# Patient Record
Sex: Male | Born: 1940 | Race: Black or African American | Hispanic: No | Marital: Single | Smoking: Former smoker
Health system: Southern US, Community
[De-identification: ages and names within clinical notes are randomized; demographics above are authoritative.]

## PROBLEM LIST (undated history)

## (undated) DIAGNOSIS — M25519 Pain in unspecified shoulder: Secondary | ICD-10-CM

## (undated) DIAGNOSIS — E119 Type 2 diabetes mellitus without complications: Secondary | ICD-10-CM

## (undated) DIAGNOSIS — R1310 Dysphagia, unspecified: Secondary | ICD-10-CM

## (undated) DIAGNOSIS — K219 Gastro-esophageal reflux disease without esophagitis: Secondary | ICD-10-CM

## (undated) DIAGNOSIS — I10 Essential (primary) hypertension: Secondary | ICD-10-CM

## (undated) DIAGNOSIS — E785 Hyperlipidemia, unspecified: Secondary | ICD-10-CM

## (undated) DIAGNOSIS — M1990 Unspecified osteoarthritis, unspecified site: Secondary | ICD-10-CM

## (undated) HISTORY — PX: BACK SURGERY: SHX140

---

## 2015-03-04 ENCOUNTER — Encounter: Payer: Self-pay | Admitting: *Deleted

## 2015-03-05 ENCOUNTER — Ambulatory Visit
Admission: RE | Admit: 2015-03-05 | Discharge: 2015-03-05 | Disposition: A | Discharge status: Home / Self Care | Payer: Medicare Other | Source: Ambulatory Visit | Attending: Gastroenterology | Admitting: Gastroenterology

## 2015-03-05 ENCOUNTER — Ambulatory Visit: Payer: Medicare Other | Admitting: Anesthesiology

## 2015-03-05 ENCOUNTER — Encounter
Admission: RE | Disposition: A | Discharge status: Home / Self Care | Payer: Self-pay | Source: Ambulatory Visit | Attending: Gastroenterology

## 2015-03-05 ENCOUNTER — Encounter: Payer: Self-pay | Admitting: Anesthesiology

## 2015-03-05 DIAGNOSIS — R1310 Dysphagia, unspecified: Secondary | ICD-10-CM | POA: Diagnosis not present

## 2015-03-05 DIAGNOSIS — Z87891 Personal history of nicotine dependence: Secondary | ICD-10-CM | POA: Insufficient documentation

## 2015-03-05 DIAGNOSIS — M1990 Unspecified osteoarthritis, unspecified site: Secondary | ICD-10-CM | POA: Insufficient documentation

## 2015-03-05 DIAGNOSIS — K635 Polyp of colon: Secondary | ICD-10-CM | POA: Diagnosis not present

## 2015-03-05 DIAGNOSIS — E119 Type 2 diabetes mellitus without complications: Secondary | ICD-10-CM | POA: Diagnosis not present

## 2015-03-05 DIAGNOSIS — E785 Hyperlipidemia, unspecified: Secondary | ICD-10-CM | POA: Insufficient documentation

## 2015-03-05 DIAGNOSIS — Z7982 Long term (current) use of aspirin: Secondary | ICD-10-CM | POA: Insufficient documentation

## 2015-03-05 DIAGNOSIS — I10 Essential (primary) hypertension: Secondary | ICD-10-CM | POA: Diagnosis not present

## 2015-03-05 DIAGNOSIS — K219 Gastro-esophageal reflux disease without esophagitis: Secondary | ICD-10-CM | POA: Diagnosis not present

## 2015-03-05 DIAGNOSIS — J45909 Unspecified asthma, uncomplicated: Secondary | ICD-10-CM | POA: Insufficient documentation

## 2015-03-05 DIAGNOSIS — K5730 Diverticulosis of large intestine without perforation or abscess without bleeding: Secondary | ICD-10-CM | POA: Diagnosis not present

## 2015-03-05 DIAGNOSIS — K644 Residual hemorrhoidal skin tags: Secondary | ICD-10-CM | POA: Diagnosis not present

## 2015-03-05 DIAGNOSIS — Z1211 Encounter for screening for malignant neoplasm of colon: Secondary | ICD-10-CM | POA: Insufficient documentation

## 2015-03-05 HISTORY — DX: Gastro-esophageal reflux disease without esophagitis: K21.9

## 2015-03-05 HISTORY — DX: Pain in unspecified shoulder: M25.519

## 2015-03-05 HISTORY — DX: Essential (primary) hypertension: I10

## 2015-03-05 HISTORY — PX: ESOPHAGOGASTRODUODENOSCOPY (EGD) WITH PROPOFOL: SHX5813

## 2015-03-05 HISTORY — PX: COLONOSCOPY WITH PROPOFOL: SHX5780

## 2015-03-05 HISTORY — DX: Dysphagia, unspecified: R13.10

## 2015-03-05 HISTORY — DX: Unspecified osteoarthritis, unspecified site: M19.90

## 2015-03-05 HISTORY — DX: Type 2 diabetes mellitus without complications: E11.9

## 2015-03-05 HISTORY — DX: Hyperlipidemia, unspecified: E78.5

## 2015-03-05 LAB — GLUCOSE, CAPILLARY: GLUCOSE-CAPILLARY: 91 mg/dL (ref 65–99)

## 2015-03-05 SURGERY — COLONOSCOPY WITH PROPOFOL
Anesthesia: General

## 2015-03-05 MED ORDER — PROPOFOL 500 MG/50ML IV EMUL
INTRAVENOUS | Status: DC | PRN
  Administered 2015-03-05: 120 ug/kg/min via INTRAVENOUS

## 2015-03-05 MED ORDER — EPHEDRINE SULFATE 50 MG/ML IJ SOLN
INTRAMUSCULAR | Status: DC | PRN
  Administered 2015-03-05: 5 mg via INTRAVENOUS

## 2015-03-05 MED ORDER — FENTANYL CITRATE (PF) 100 MCG/2ML IJ SOLN
INTRAMUSCULAR | Status: DC | PRN
Start: 2015-03-05 — End: 2015-03-05
  Administered 2015-03-05: 50 ug via INTRAVENOUS

## 2015-03-05 MED ORDER — MIDAZOLAM HCL 2 MG/2ML IJ SOLN
INTRAMUSCULAR | Status: DC | PRN
  Administered 2015-03-05: 1 mg via INTRAVENOUS

## 2015-03-05 MED ORDER — LIDOCAINE HCL (CARDIAC) 20 MG/ML IV SOLN
INTRAVENOUS | Status: DC | PRN
  Administered 2015-03-05: 50 mg via INTRAVENOUS

## 2015-03-05 MED ORDER — SODIUM CHLORIDE 0.9 % IV SOLN
INTRAVENOUS | Status: DC
  Administered 2015-03-05: 10:00:00 via INTRAVENOUS

## 2015-03-05 NOTE — Anesthesia Procedure Notes (Signed)
Performed by: COOK-MARTIN, Addysen Louth Pre-anesthesia Checklist: Patient identified, Emergency Drugs available, Suction available, Patient being monitored and Timeout performed Patient Re-evaluated:Patient Re-evaluated prior to inductionOxygen Delivery Method: Nasal cannula Preoxygenation: Pre-oxygenation with 100% oxygen Intubation Type: IV induction Airway Equipment and Method: Bite block Placement Confirmation: positive ETCO2 and CO2 detector     

## 2015-03-05 NOTE — H&P (Signed)
Primary Care Physician:  Barbette Reichmann, MD  Pre-Procedure History & Physical: HPI:  Luke Owen is a 74 y.o. male is here for an colonoscopy / EGD   Past Medical History  Diagnosis Date  . Hypertension   . Diabetes mellitus without complication (HCC)   . Hyperlipidemia   . Arthritis   . Shoulder joint pain   . GERD (gastroesophageal reflux disease)   . Dysphagia     Past Surgical History  Procedure Laterality Date  . Back surgery      2012    Prior to Admission medications   Medication Sig Start Date End Date Taking? Authorizing Provider  Albuterol Sulfate (PROAIR RESPICLICK) 108 (90 BASE) MCG/ACT AEPB Inhale 2 puffs into the lungs 4 (four) times daily as needed.   Yes Historical Provider, MD  ascorbic acid (VITAMIN C) 1000 MG tablet Take 1,000 mg by mouth daily.   Yes Historical Provider, MD  aspirin EC 325 MG tablet Take 325 mg by mouth daily.   Yes Historical Provider, MD  atorvastatin (LIPITOR) 40 MG tablet Take 40 mg by mouth daily.   Yes Historical Provider, MD  glipiZIDE (GLUCOTROL XL) 10 MG 24 hr tablet Take 10 mg by mouth daily with breakfast.   Yes Historical Provider, MD  losartan (COZAAR) 100 MG tablet Take 100 mg by mouth daily.   Yes Historical Provider, MD  meloxicam (MOBIC) 7.5 MG tablet Take 7.5 mg by mouth daily.   Yes Historical Provider, MD  metFORMIN (GLUCOPHAGE) 1000 MG tablet Take 1,000 mg by mouth 2 (two) times daily with a meal.   Yes Historical Provider, MD  Multiple Vitamin (MULTIVITAMIN) tablet Take 1 tablet by mouth daily.   Yes Historical Provider, MD  pantoprazole (PROTONIX) 40 MG tablet Take 40 mg by mouth 2 (two) times daily before a meal.   Yes Historical Provider, MD  sildenafil (VIAGRA) 100 MG tablet Take 100 mg by mouth daily as needed for erectile dysfunction.   Yes Historical Provider, MD  vitamin E 400 UNIT capsule Take 400 Units by mouth daily.   Yes Historical Provider, MD    Allergies as of 02/03/2015  . (Not on File)     History reviewed. No pertinent family history.  Social History   Social History  . Marital Status: Single    Spouse Name: N/A  . Number of Children: N/A  . Years of Education: N/A   Occupational History  . Not on file.   Social History Main Topics  . Smoking status: Former Smoker    Quit date: 03/20/1964  . Smokeless tobacco: Never Used  . Alcohol Use: No  . Drug Use: No  . Sexual Activity: Not on file   Other Topics Concern  . Not on file   Social History Narrative     Physical Exam: BP 151/75 mmHg  Pulse 55  Temp(Src) 98.5 F (36.9 C) (Oral)  Resp 17  Ht  (1.803 m)  Wt 91.627 kg (202 lb)  BMI 28.19 kg/m2  SpO2 99% General:   Alert,  pleasant and cooperative in NAD Head:  Normocephalic and atraumatic. Neck:  Supple; no masses or thyromegaly. Lungs:  Clear throughout to auscultation.    Heart:  Regular rate and rhythm. Abdomen:  Soft, nontender and nondistended. Normal bowel sounds, without guarding, and without rebound.   Neurologic:  Alert and  oriented x4;  grossly normal neurologically.  Impression/Plan: Luke Owen is here for an colonoscopy to be performed for screening, EGD for dysphagia  Risks,  benefits, limitations, and alternatives regarding  Colonoscopy / EGD have been reviewed with the patient.  Questions have been answered.  All parties agreeable.   Elnita Maxwell, MD  03/05/2015, 9:41 AM

## 2015-03-05 NOTE — Transfer of Care (Signed)
Immediate Anesthesia Transfer of Care Note  Patient: Luke Owen  Procedure(s) Performed: Procedure(s): COLONOSCOPY WITH PROPOFOL (N/A) ESOPHAGOGASTRODUODENOSCOPY (EGD) WITH PROPOFOL (N/A)  Patient Location: PACU  Anesthesia Type:General  Level of Consciousness: awake and oriented  Airway & Oxygen Therapy: Patient Spontanous Breathing and Patient connected to nasal cannula oxygen  Post-op Assessment: Report given to RN and Post -op Vital signs reviewed and stable  Post vital signs: Reviewed and stable  Last Vitals:  Filed Vitals:   03/05/15 0937  BP: 151/75  Pulse: 55  Temp: 36.9 C  Resp: 17    Complications: No apparent anesthesia complications

## 2015-03-05 NOTE — Anesthesia Preprocedure Evaluation (Signed)
Anesthesia Evaluation  Patient identified by MRN, date of birth, ID band Patient awake    Reviewed: Allergy & Precautions, H&P , NPO status , Patient's Chart, lab work & pertinent test results  Airway Mallampati: III  TM Distance: >3 FB Neck ROM: limited    Dental no notable dental hx. (+) Teeth Intact   Pulmonary neg shortness of breath, asthma , former smoker,    Pulmonary exam normal breath sounds clear to auscultation       Cardiovascular Exercise Tolerance: Good hypertension, (-) Past MI Normal cardiovascular exam Rhythm:regular Rate:Normal     Neuro/Psych negative neurological ROS  negative psych ROS   GI/Hepatic Neg liver ROS, GERD  Controlled,  Endo/Other  diabetes, Well Controlled, Type 2, Oral Hypoglycemic Agents  Renal/GU negative Renal ROS  negative genitourinary   Musculoskeletal   Abdominal   Peds  Hematology negative hematology ROS (+)   Anesthesia Other Findings Past Medical History:   Hypertension                                                 Diabetes mellitus without complication (HCC)                 Hyperlipidemia                                               Arthritis                                                    Shoulder joint pain                                          GERD (gastroesophageal reflux disease)                       Dysphagia                                                    Reproductive/Obstetrics negative OB ROS                             Anesthesia Physical Anesthesia Plan  ASA: III  Anesthesia Plan: General   Post-op Pain Management:    Induction:   Airway Management Planned:   Additional Equipment:   Intra-op Plan:   Post-operative Plan:   Informed Consent: I have reviewed the patients History and Physical, chart, labs and discussed the procedure including the risks, benefits and alternatives for the proposed anesthesia  with the patient or authorized representative who has indicated his/her understanding and acceptance.   Dental Advisory Given  Plan Discussed with: Anesthesiologist, CRNA and Surgeon  Anesthesia Plan Comments:         Anesthesia Quick Evaluation

## 2015-03-05 NOTE — Anesthesia Postprocedure Evaluation (Signed)
  Anesthesia Post-op Note  Patient: Luke Owen  Procedure(s) Performed: Procedure(s): COLONOSCOPY WITH PROPOFOL (N/A) ESOPHAGOGASTRODUODENOSCOPY (EGD) WITH PROPOFOL (N/A)  Anesthesia type:General  Patient location: PACU  Post pain: Pain level controlled  Post assessment: Post-op Vital signs reviewed, Patient's Cardiovascular Status Stable, Respiratory Function Stable, Patent Airway and No signs of Nausea or vomiting  Post vital signs: Reviewed and stable  Last Vitals:  Filed Vitals:   03/05/15 1110  BP: 151/84  Pulse: 51  Temp:   Resp: 14    Level of consciousness: awake, alert  and patient cooperative  Complications: No apparent anesthesia complications

## 2015-03-05 NOTE — Op Note (Signed)
[facility] Gastroenterology Patient Name: Luke Owen Procedure Date: 03/05/2015 9:42 AM MRN: [id number] Account #: [id number] Date of Birth: [birth year] Admit Type: Outpatient Age: 74 Room: [facility] ENDO ROOM 2 Gender: Male Note Status: Finalized Procedure:         Upper GI endoscopy Indications:       Dysphagia(improved on PPI) Patient Profile:   This is a 74 year old male. Providers:         Rhona Raider. Shelle Iron, MD Referring MD:      Barbette Reichmann, MD (Referring MD) Medicines:         Propofol per Anesthesia Complications:     No immediate complications. Procedure:         Pre-Anesthesia Assessment:                    - Prior to the procedure, a History and Physical was                     performed, and patient medications, allergies and                     sensitivities were reviewed. The patient's tolerance of                     previous anesthesia was reviewed.                    After obtaining informed consent, the endoscope was passed                     under direct vision. Throughout the procedure, the                     patient's blood pressure, pulse, and oxygen saturations                     were monitored continuously. The Endoscope was introduced                     through the mouth, and advanced to the second part of                     duodenum. The upper GI endoscopy was accomplished without                     difficulty. The patient tolerated the procedure well. Findings:      The esophagus was normal.      The stomach was normal.      The examined duodenum was normal.      Four biopsies were obtained with cold forceps for histology randomly in       the lower third of the esophagus.      Four biopsies were obtained with cold forceps for histology randomly in       the upper third of the esophagus. Impression:        - Normal esophagus.                    - Normal stomach.                    - Normal examined duodenum.  - Four biopsies were obtained in the lower third of the                     esophagus.                    -  Four biopsies were obtained in the upper third of the                     esophagus. Recommendation:    - Perform a colonoscopy today.                    - If biopsies are non-diagnostic and trouble swallowing                     does not continue to improve, obtain esophageal manometry                     study                    - Await pathology results.                    - Continue present medications.                    - The findings and recommendations were discussed with the                     patient.                    - The findings and recommendations were discussed with the                     patient's family. Procedure Code(s): --- Professional ---                    [phone], Esophagogastroduodenoscopy, flexible, transoral;                     with biopsy, single or multiple CPT copyright 2014 American [employer] Association. All rights reserved. The codes documented in this report are preliminary and upon coder review may  be revised to meet current compliance requirements. Kathalene Frames, MD 03/05/2015 10:00:16 AM This report has been signed electronically. Number of Addenda: 0 Note Initiated On: 03/05/2015 9:42 AM      [facility]

## 2015-03-05 NOTE — Op Note (Signed)
[facility] Gastroenterology Patient Name: Luke Owen Procedure Date: 03/05/2015 10:00 AM MRN: [id number] Account #: [id number] Date of Birth: 30-Dec-1940 Admit Type: Outpatient Age: 74 Room: [facility] ENDO ROOM 2 Gender: Male Note Status: Finalized Procedure:         Colonoscopy Indications:       Screening for colorectal malignant neoplasm, Last                     colonoscopy 10 years ago Patient Profile:   This is a 74 year old male. Providers:         Rhona Raider. Shelle Iron, MD Referring MD:      Barbette Reichmann, MD (Referring MD) Medicines:         Propofol per Anesthesia Complications:     No immediate complications. Procedure:         Pre-Anesthesia Assessment:                    - Prior to the procedure, a History and Physical was                     performed, and patient medications, allergies and                     sensitivities were reviewed. The patient's tolerance of                     previous anesthesia was reviewed.                    After obtaining informed consent, the colonoscope was                     passed under direct vision. Throughout the procedure, the                     patient's blood pressure, pulse, and oxygen saturations                     were monitored continuously. The Colonoscope was                     introduced through the anus and advanced to the the cecum,                     identified by appendiceal orifice and ileocecal valve. The                     colonoscopy was performed without difficulty. The patient                     tolerated the procedure well. The quality of the bowel                     preparation was excellent. Findings:      The perianal exam findings include non-thrombosed external hemorrhoids.      A 3 mm polyp was found in the ascending colon. The polyp was sessile.       The polyp was removed with a jumbo cold forceps. Resection and retrieval       were complete.      A few large-mouthed diverticula  were found in the ascending colon.      A few small and large-mouthed diverticula were found in the sigmoid       colon.  The exam was otherwise without abnormality. Impression:        - Non-thrombosed external hemorrhoids found on perianal                     exam.                    - One 3 mm polyp in the ascending colon. Resected and                     retrieved.                    - Diverticulosis in the ascending colon.                    - Diverticulosis in the sigmoid colon.                    - The examination was otherwise normal. Recommendation:    - Observe patient in GI recovery unit.                    - High fiber diet.                    - Continue present medications.                    - Await pathology results.                    - Would not perform further screening colonoscopies based                     on age.                    - The findings and recommendations were discussed with the                     patient.                    - The findings and recommendations were discussed with the                     patient's family. Procedure Code(s): --- Professional ---                    [id number], Colonoscopy, flexible; with biopsy, single or                     multiple CPT copyright 2014 American Medical Association. All rights reserved. The codes documented in this report are preliminary and upon coder review may  be revised to meet current compliance requirements. Kathalene Frames, MD 03/05/2015 10:27:06 AM This report has been signed electronically. Number of Addenda: 0 Note Initiated On: 03/05/2015 10:00 AM Scope Withdrawal Time: 0 hours 10 minutes 52 seconds  Total Procedure Duration: 0 hours 19 minutes 37 seconds       [facility]

## 2015-03-08 ENCOUNTER — Encounter: Payer: Self-pay | Admitting: Gastroenterology

## 2015-03-08 LAB — SURGICAL PATHOLOGY

## 2016-09-21 ENCOUNTER — Ambulatory Visit
Admission: RE | Admit: 2016-09-21 | Discharge: 2016-09-21 | Disposition: A | Discharge status: Home / Self Care | Payer: Medicare Other | Source: Ambulatory Visit | Attending: Internal Medicine | Admitting: Internal Medicine

## 2016-09-21 ENCOUNTER — Other Ambulatory Visit: Payer: Self-pay | Admitting: Internal Medicine

## 2016-09-21 DIAGNOSIS — E11622 Type 2 diabetes mellitus with other skin ulcer: Secondary | ICD-10-CM

## 2016-09-21 DIAGNOSIS — L97909 Non-pressure chronic ulcer of unspecified part of unspecified lower leg with unspecified severity: Secondary | ICD-10-CM | POA: Insufficient documentation

## 2016-09-21 DIAGNOSIS — M7989 Other specified soft tissue disorders: Secondary | ICD-10-CM

## 2016-10-05 ENCOUNTER — Encounter: Payer: Self-pay | Admitting: *Deleted

## 2016-10-05 ENCOUNTER — Emergency Department
Admission: EM | Admit: 2016-10-05 | Discharge: 2016-10-05 | Disposition: A | Discharge status: Home / Self Care | Payer: Medicare Other | Attending: Emergency Medicine | Admitting: Emergency Medicine

## 2016-10-05 ENCOUNTER — Emergency Department: Payer: Medicare Other

## 2016-10-05 DIAGNOSIS — Z87891 Personal history of nicotine dependence: Secondary | ICD-10-CM | POA: Diagnosis not present

## 2016-10-05 DIAGNOSIS — Z7982 Long term (current) use of aspirin: Secondary | ICD-10-CM | POA: Diagnosis not present

## 2016-10-05 DIAGNOSIS — I10 Essential (primary) hypertension: Secondary | ICD-10-CM | POA: Diagnosis not present

## 2016-10-05 DIAGNOSIS — R109 Unspecified abdominal pain: Secondary | ICD-10-CM | POA: Diagnosis present

## 2016-10-05 DIAGNOSIS — Z7984 Long term (current) use of oral hypoglycemic drugs: Secondary | ICD-10-CM | POA: Insufficient documentation

## 2016-10-05 DIAGNOSIS — N3289 Other specified disorders of bladder: Secondary | ICD-10-CM

## 2016-10-05 DIAGNOSIS — Z79899 Other long term (current) drug therapy: Secondary | ICD-10-CM | POA: Diagnosis not present

## 2016-10-05 DIAGNOSIS — E119 Type 2 diabetes mellitus without complications: Secondary | ICD-10-CM | POA: Diagnosis not present

## 2016-10-05 LAB — URINALYSIS, COMPLETE (UACMP) WITH MICROSCOPIC
Bacteria, UA: NONE SEEN
Bilirubin Urine: NEGATIVE
GLUCOSE, UA: NEGATIVE mg/dL
Hgb urine dipstick: NEGATIVE
KETONES UR: NEGATIVE mg/dL
Leukocytes, UA: NEGATIVE
Nitrite: NEGATIVE
PROTEIN: NEGATIVE mg/dL
Specific Gravity, Urine: 1.015 (ref 1.005–1.030)
Squamous Epithelial / LPF: NONE SEEN
pH: 5 (ref 5.0–8.0)

## 2016-10-05 LAB — CBC
HCT: 35.1 % — ABNORMAL LOW (ref 40.0–52.0)
HEMOGLOBIN: 12.1 g/dL — AB (ref 13.0–18.0)
MCH: 29.7 pg (ref 26.0–34.0)
MCHC: 34.5 g/dL (ref 32.0–36.0)
MCV: 85.8 fL (ref 80.0–100.0)
Platelets: 172 10*3/uL (ref 150–440)
RBC: 4.09 MIL/uL — AB (ref 4.40–5.90)
RDW: 14.5 % (ref 11.5–14.5)
WBC: 4.4 10*3/uL (ref 3.8–10.6)

## 2016-10-05 LAB — COMPREHENSIVE METABOLIC PANEL
ALT: 20 U/L (ref 17–63)
ANION GAP: 9 (ref 5–15)
AST: 33 U/L (ref 15–41)
Albumin: 4.3 g/dL (ref 3.5–5.0)
Alkaline Phosphatase: 72 U/L (ref 38–126)
BUN: 21 mg/dL — ABNORMAL HIGH (ref 6–20)
CALCIUM: 9.4 mg/dL (ref 8.9–10.3)
CHLORIDE: 105 mmol/L (ref 101–111)
CO2: 24 mmol/L (ref 22–32)
Creatinine, Ser: 1.15 mg/dL (ref 0.61–1.24)
GFR calc non Af Amer: 60 mL/min — ABNORMAL LOW (ref >60)
Glucose, Bld: 95 mg/dL (ref 65–99)
POTASSIUM: 3.7 mmol/L (ref 3.5–5.1)
Sodium: 138 mmol/L (ref 135–145)
Total Bilirubin: 0.9 mg/dL (ref 0.3–1.2)
Total Protein: 7.6 g/dL (ref 6.5–8.1)

## 2016-10-05 LAB — LIPASE, BLOOD: Lipase: 39 U/L (ref 11–51)

## 2016-10-05 MED ORDER — IOPAMIDOL (ISOVUE-300) INJECTION 61%
100.0000 mL | Freq: Once | INTRAVENOUS | Status: AC | PRN
  Administered 2016-10-05: 100 mL via INTRAVENOUS

## 2016-10-05 MED ORDER — IOPAMIDOL (ISOVUE-300) INJECTION 61%
30.0000 mL | Freq: Once | INTRAVENOUS | Status: AC | PRN
  Administered 2016-10-05: 30 mL via ORAL

## 2016-10-05 NOTE — ED Provider Notes (Signed)
[facility] Emergency Department Provider Note   ____________________________________________   First MD Initiated Contact with Patient 10/05/16 [phone]     (approximate)  I have reviewed the triage vital signs and the nursing notes.   HISTORY  Chief Complaint Flank Pain    HPI Luke Owen is a 76 y.o. male here for evaluation of pain along the left back and flank.  Patient reports for about the last 5 days he knows an aching discomfort in his left lower back, today this pain seemed to worsen and move about somewhat towards his left lower abdomen. It is relatively sharp, improved by resting, made worse particularly by trying to bend forward. Denies any injury, but reports pain did start to come about at the time he was moving boxes that he gets ready to move to [place]. No fevers or chills. No nausea or vomiting. Reports pain is sharp and moderate in intensity when it is present, right now at rest he denies any pain.  He did have 2 somewhat loose, nonbloody bowel movements this morning which is not what he would call usual, but also reports is not too unusual either. Does have a history of kidney stones, no blood or changed with urination. Denies any testicular pain no penile pain   Past Medical History:  Diagnosis Date  . Arthritis   . Diabetes mellitus without complication (HCC)   . Dysphagia   . GERD (gastroesophageal reflux disease)   . Hyperlipidemia   . Hypertension   . Shoulder joint pain     There are no active problems to display for this patient.   Past Surgical History:  Procedure Laterality Date  . BACK SURGERY     2012  . COLONOSCOPY WITH PROPOFOL N/A 03/05/2015   Procedure: COLONOSCOPY WITH PROPOFOL;  Surgeon: Elnita Maxwell, MD;  Location: [facility] ENDOSCOPY;  Service: Endoscopy;  Laterality: N/A;  . ESOPHAGOGASTRODUODENOSCOPY (EGD) WITH PROPOFOL N/A 03/05/2015   Procedure: ESOPHAGOGASTRODUODENOSCOPY (EGD) WITH PROPOFOL;  Surgeon:  Elnita Maxwell, MD;  Location: [facility] ENDOSCOPY;  Service: Endoscopy;  Laterality: N/A;    Prior to Admission medications   Medication Sig Start Date End Date Taking? Authorizing Provider  aspirin EC 81 MG tablet Take 325 mg by mouth daily.   Yes [provider]  atorvastatin (LIPITOR) 40 MG tablet Take 40 mg by mouth daily.   Yes [provider]  ferrous sulfate 325 (65 FE) MG tablet Take 325 mg by mouth daily with breakfast.   Yes [provider]  glipiZIDE (GLUCOTROL XL) 10 MG 24 hr tablet Take 10 mg by mouth daily with breakfast.   Yes [provider]  glipiZIDE (GLUCOTROL) 5 MG tablet Take 5 mg by mouth at bedtime.   Yes [provider]  losartan-hydrochlorothiazide (HYZAAR) 100-25 MG tablet Take 1 tablet by mouth daily.   Yes [provider]  metFORMIN (GLUCOPHAGE) 1000 MG tablet Take 1,000 mg by mouth 2 (two) times daily with a meal.   Yes [provider]  mometasone (ELOCON) 0.1 % cream Apply 1 application topically daily.   Yes [provider]  Multiple Vitamin (MULTIVITAMIN) tablet Take 1 tablet by mouth daily.   Yes [provider]  Albuterol Sulfate (PROAIR RESPICLICK) 108 (90 BASE) MCG/ACT AEPB Inhale 2 puffs into the lungs 4 (four) times daily as needed.    [provider]  clobetasol (TEMOVATE) 0.05 % external solution Apply 1 application topically 2 (two) times daily as needed.    [provider]  hydrochlorothiazide (HYDRODIURIL) 25 MG tablet Take 25 mg by mouth daily as needed.    [provider]    Allergies Patient has no known allergies.  No family history on file.  Social History Social History  Substance Use Topics  . Smoking status: Former Smoker    Quit date: 03/20/1964  . Smokeless tobacco: Never Used  . Alcohol use No    Review of Systems Constitutional: No fever/chills Eyes: No visual changes. ENT: No sore throat. Cardiovascular: Denies chest  pain. Respiratory: Denies shortness of breath. Gastrointestinal: See history of present illness  No nausea, no vomiting.   No constipation. Genitourinary: Negative for dysuria. Musculoskeletal: Negative for back pain. Skin: Negative for rash. Neurological: Negative for headaches, focal weakness or numbness.  10-point ROS otherwise negative.  ____________________________________________   PHYSICAL EXAM:  VITAL SIGNS: ED Triage Vitals  Enc Vitals Group     BP 10/05/16 0835 124/78     Pulse Rate 10/05/16 0835 71     Resp 10/05/16 0835 20     Temp 10/05/16 0835 98.2 F (36.8 C)     Temp Source 10/05/16 0835 Oral     SpO2 10/05/16 0835 97 %     Weight 10/05/16 0836 190 lb (86.2 kg)     Height 10/05/16 0836 5\' 11"  (1.803 m)     Head Circumference --      Peak Flow --      Pain Score 10/05/16 0835 8     Pain Loc --      Pain Edu? --      Excl. in GC? --     Constitutional: Alert and oriented. Well appearing and in no acute distress.He and his wife are both very pleasant. Eyes: Conjunctivae are normal. PERRL. EOMI. Head: Atraumatic. Nose: No congestion/rhinnorhea. Mouth/Throat: Mucous membranes are moist.  Oropharynx non-erythematous. Neck: No stridor.   Cardiovascular: Normal rate, regular rhythm. Grossly normal heart sounds.  Good peripheral circulation. Respiratory: Normal respiratory effort.  No retractions. Lungs CTAB. Gastrointestinal: Soft and nontender except for some very minimal discomfort to deep palpation in the left flank without rebound or guarding. No distention. No abdominal bruits. No CVA tenderness. Testicles no swelling. Musculoskeletal: No lower extremity tenderness nor edema.  Neurologic:  Normal speech and language. No gross focal neurologic deficits are appreciated.Skin:  Skin is warm, dry and intact. No rash noted. Psychiatric: Mood and affect are normal. Speech and behavior are normal.  ____________________________________________   LABS (all labs  ordered are listed, but only abnormal results are displayed)  Labs Reviewed  COMPREHENSIVE METABOLIC PANEL - Abnormal; Notable for the following:       Result Value   BUN 21 (*)    GFR calc non Af Amer 60 (*)    All other components within normal limits  CBC - Abnormal; Notable for the following:    RBC 4.09 (*)    Hemoglobin 12.1 (*)    HCT 35.1 (*)    All other components within normal limits  URINALYSIS, COMPLETE (UACMP) WITH MICROSCOPIC - Abnormal; Notable for the following:    Color, Urine YELLOW (*)    APPearance CLEAR (*)    All other components within normal limits  LIPASE, BLOOD   ____________________________________________  EKG   ____________________________________________  RADIOLOGY  Ct Abdomen Pelvis W Contrast  Result Date: 10/05/2016 CLINICAL DATA:  Left flank pain EXAM: CT ABDOMEN AND PELVIS WITH CONTRAST TECHNIQUE: Multidetector CT imaging of the abdomen and pelvis was performed using the  standard protocol following bolus administration of intravenous contrast. CONTRAST:  100mL ISOVUE-300 IOPAMIDOL (ISOVUE-300) INJECTION 61% COMPARISON:  None. FINDINGS: Lower chest: Lung bases clear Hepatobiliary: Negative Pancreas: Negative Spleen: Negative Adrenals/Urinary Tract: Normal kidneys. No renal mass, obstruction, or calculus. Moderate thickening of the urinary bladder with associated prostate enlargement. Stomach/Bowel: Mild sigmoid diverticulosis without diverticulitis. No bowel mass or edema. Negative for bowel obstruction. Appendix not visualized. Vascular/Lymphatic: Mild atherosclerotic disease. Reproductive: Marked prostate enlargement measuring 5.1 x 5.6 cm with associated bladder wall thickening Other: Negative for free fluid or adenopathy.  Negative for hernia. Musculoskeletal: Pedicle screw fusion bilaterally L4-5. No acute skeletal abnormality. IMPRESSION: No cause for acute left flank pain identified. Mild sigmoid diverticulosis without diverticulitis. 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 Electronically Signed   By: Marlan Palauharles  Clark M.D.   On: 10/05/2016 10:20    ____________________________________________   PROCEDURES  Procedure(s) performed: None  Procedures  Critical Care performed: No  ____________________________________________   INITIAL IMPRESSION / ASSESSMENT AND PLAN / ED COURSE  Pertinent labs & imaging results that were available during my care of the patient were reviewed by me and considered in my medical decision making (see chart for details).  Differential diagnosis includes but is not limited to, abdominal perforation, aortic dissection, cholecystitis, appendicitis, diverticulitis, colitis, esophagitis/gastritis, kidney stone, pyelonephritis, urinary tract infection, aortic aneurysm. All are considered in decision and treatment plan. Based upon the patient's presentation and risk factors, I suspect this may represent musculoskeletal etiologies pursued given the history of recently moving boxes in the discomfort is worse with flexing forward. He does have some mild tenderness reports a couple loose stools today, we will obtain a CT to further evaluate for intra-abdominal process such as early diverticulitis, kidney stone, or felt far less likely vascular anomaly. No cardiac or pulmonary symptoms. No neurologic symptoms.     ----------------------------------------- 10:50 AM on 10/05/2016 -----------------------------------------  CT scan reassuring with no acute obvious findings. Urinalysis negative. Patient pain free on reevaluation, resting comfortably at this time. Discussed careful return precautions and also discussed follow-up with Dr. Marcello FennelHande and reviewed that his prostate and bladder appear slightly thickened and recommended further follow-up which patient agrees to.  ____________________________________________   FINAL CLINICAL IMPRESSION(S) / ED  DIAGNOSES  Final diagnoses:  Flank pain  Bladder wall thickening      NEW MEDICATIONS STARTED DURING THIS VISIT:  New Prescriptions   No medications on file     Note:  This document was prepared using Dragon voice recognition software and may include unintentional dictation errors.     Sharyn CreamerQuale, Mark, MD 10/05/16 1050

## 2016-10-05 NOTE — Discharge Instructions (Signed)
Your bladder and prostate appear slightly enlarged on today's CAT scan as we discussed. I ask that you please follow-up with Dr. Marcello FennelHande as this may require further testing and follow-up.  Please return to the emergency room right away if you are to develop a fever, severe nausea, your pain becomes severe or worsens, you are unable to keep food down, begin vomiting any dark or bloody fluid, you develop any dark or bloody stools, feel dehydrated, or other new concerns or symptoms arise.

## 2016-10-05 NOTE — ED Triage Notes (Signed)
Pt complains of left flank pain radiating to left lower abdomen for 2 days, pt reports chills last night

## 2016-10-06 ENCOUNTER — Encounter: Payer: Self-pay | Admitting: Emergency Medicine

## 2016-10-06 DIAGNOSIS — Z7984 Long term (current) use of oral hypoglycemic drugs: Secondary | ICD-10-CM | POA: Diagnosis not present

## 2016-10-06 DIAGNOSIS — E119 Type 2 diabetes mellitus without complications: Secondary | ICD-10-CM | POA: Diagnosis not present

## 2016-10-06 DIAGNOSIS — Z79899 Other long term (current) drug therapy: Secondary | ICD-10-CM | POA: Insufficient documentation

## 2016-10-06 DIAGNOSIS — R1032 Left lower quadrant pain: Secondary | ICD-10-CM | POA: Diagnosis present

## 2016-10-06 DIAGNOSIS — I10 Essential (primary) hypertension: Secondary | ICD-10-CM | POA: Insufficient documentation

## 2016-10-06 DIAGNOSIS — Z87891 Personal history of nicotine dependence: Secondary | ICD-10-CM | POA: Insufficient documentation

## 2016-10-06 LAB — BASIC METABOLIC PANEL
ANION GAP: 11 (ref 5–15)
BUN: 18 mg/dL (ref 6–20)
CHLORIDE: 104 mmol/L (ref 101–111)
CO2: 24 mmol/L (ref 22–32)
CREATININE: 1.16 mg/dL (ref 0.61–1.24)
Calcium: 9.4 mg/dL (ref 8.9–10.3)
GFR calc Af Amer: >60 mL/min (ref >60)
GFR calc non Af Amer: 59 mL/min — ABNORMAL LOW (ref >60)
Glucose, Bld: 151 mg/dL — ABNORMAL HIGH (ref 65–99)
Potassium: 3.5 mmol/L (ref 3.5–5.1)
SODIUM: 139 mmol/L (ref 135–145)

## 2016-10-06 LAB — CBC
HCT: 33.7 % — ABNORMAL LOW (ref 40.0–52.0)
Hemoglobin: 11.6 g/dL — ABNORMAL LOW (ref 13.0–18.0)
MCH: 30 pg (ref 26.0–34.0)
MCHC: 34.5 g/dL (ref 32.0–36.0)
MCV: 86.9 fL (ref 80.0–100.0)
Platelets: 183 10*3/uL (ref 150–440)
RBC: 3.88 MIL/uL — ABNORMAL LOW (ref 4.40–5.90)
RDW: 14.1 % (ref 11.5–14.5)
WBC: 8 10*3/uL (ref 3.8–10.6)

## 2016-10-06 NOTE — ED Triage Notes (Signed)
Pt ambulatory to triage in nad, report left flank pain x couple weeks, report seen in ED 5/10, CT scan completed, stated bladder and prostate appear slightly enlarged.  Pt still concerned it could be kidney stone.  Pt denies n/v.

## 2016-10-07 ENCOUNTER — Emergency Department
Admission: EM | Admit: 2016-10-07 | Discharge: 2016-10-07 | Disposition: A | Discharge status: Home / Self Care | Payer: Medicare Other | Attending: Emergency Medicine | Admitting: Emergency Medicine

## 2016-10-07 DIAGNOSIS — R1032 Left lower quadrant pain: Secondary | ICD-10-CM | POA: Diagnosis not present

## 2016-10-07 DIAGNOSIS — R109 Unspecified abdominal pain: Secondary | ICD-10-CM

## 2016-10-07 LAB — URINALYSIS, COMPLETE (UACMP) WITH MICROSCOPIC
BILIRUBIN URINE: NEGATIVE
Bacteria, UA: NONE SEEN
Glucose, UA: NEGATIVE mg/dL
Hgb urine dipstick: NEGATIVE
KETONES UR: NEGATIVE mg/dL
Leukocytes, UA: NEGATIVE
Nitrite: NEGATIVE
PH: 5 (ref 5.0–8.0)
Protein, ur: 30 mg/dL — AB
RBC / HPF: NONE SEEN RBC/hpf (ref 0–5)
Specific Gravity, Urine: 1.021 (ref 1.005–1.030)

## 2016-10-07 MED ORDER — IBUPROFEN 800 MG PO TABS
800.0000 mg | ORAL_TABLET | Freq: Once | ORAL | Status: AC
  Administered 2016-10-07: 800 mg via ORAL

## 2016-10-07 MED ORDER — IBUPROFEN 800 MG PO TABS
ORAL_TABLET | ORAL | Status: AC
  Filled 2016-10-07: qty 1

## 2016-10-07 MED ORDER — DICLOFENAC SODIUM 3 % TD GEL: 1.0000 "application " | Freq: Two times a day (BID) | TRANSDERMAL | 0 refills | Status: AC | PRN

## 2016-10-07 NOTE — ED Notes (Signed)
ED Provider at bedside. 

## 2016-10-07 NOTE — ED Notes (Signed)
Patient c/o left flank pain. Pt reports being seen Thursday for same. Pt denies current pain however reports pain increased to 10 out of 10 with movement.

## 2016-10-07 NOTE — ED Provider Notes (Signed)
[facility] Emergency Department Provider Note  ____________________________________________   First MD Initiated Contact with Patient 10/07/16 [phone]     (approximate)  I have reviewed the triage vital signs and the nursing notes.   HISTORY  Chief Complaint Flank Pain (left)   HPI Luke Owen is a 76 y.o. male with a history of arthritis as well as diabetes who is presenting with 4-5 weeks in the left lower quadrant abdominal pain which radiates through to his back. He says that the pain is worse with movement and especially when he extends his back and stretches his abdomen. He denies any nausea vomiting or diarrhea today. Denies any burning with urination. Was here 2 days ago for the same complaint and had a CAT scan which did not show any acute process. Patient says that he has not been taking any medication at home for this issue. Return today because of worsening of his symptoms.   Past Medical History:  Diagnosis Date  . Arthritis   . Diabetes mellitus without complication (HCC)   . Dysphagia   . GERD (gastroesophageal reflux disease)   . Hyperlipidemia   . Hypertension   . Shoulder joint pain     There are no active problems to display for this patient.   Past Surgical History:  Procedure Laterality Date  . BACK SURGERY     2012  . COLONOSCOPY WITH PROPOFOL N/A 03/05/2015   Procedure: COLONOSCOPY WITH PROPOFOL;  Surgeon: Elnita Maxwell, MD;  Location: [facility] ENDOSCOPY;  Service: Endoscopy;  Laterality: N/A;  . ESOPHAGOGASTRODUODENOSCOPY (EGD) WITH PROPOFOL N/A 03/05/2015   Procedure: ESOPHAGOGASTRODUODENOSCOPY (EGD) WITH PROPOFOL;  Surgeon: Elnita Maxwell, MD;  Location: [facility] ENDOSCOPY;  Service: Endoscopy;  Laterality: N/A;    Prior to Admission medications   Medication Sig Start Date End Date Taking? Authorizing Provider  Albuterol Sulfate (PROAIR RESPICLICK) 108 (90 BASE) MCG/ACT AEPB Inhale 2 puffs into the lungs 4 (four) times  daily as needed.    [provider]  aspirin EC 81 MG tablet Take 325 mg by mouth daily.    [provider]  atorvastatin (LIPITOR) 40 MG tablet Take 40 mg by mouth daily.    [provider]  clobetasol (TEMOVATE) 0.05 % external solution Apply 1 application topically 2 (two) times daily as needed.    [provider]  ferrous sulfate 325 (65 FE) MG tablet Take 325 mg by mouth daily with breakfast.    [provider]  glipiZIDE (GLUCOTROL XL) 10 MG 24 hr tablet Take 10 mg by mouth daily with breakfast.    [provider]  glipiZIDE (GLUCOTROL) 5 MG tablet Take 5 mg by mouth at bedtime.    [provider]  hydrochlorothiazide (HYDRODIURIL) 25 MG tablet Take 25 mg by mouth daily as needed.    [provider]  losartan-hydrochlorothiazide (HYZAAR) 100-25 MG tablet Take 1 tablet by mouth daily.    [provider]  metFORMIN (GLUCOPHAGE) 1000 MG tablet Take 1,000 mg by mouth 2 (two) times daily with a meal.    [provider]  mometasone (ELOCON) 0.1 % cream Apply 1 application topically daily.    [provider]  Multiple Vitamin (MULTIVITAMIN) tablet Take 1 tablet by mouth daily.    [provider]    Allergies Patient has no known allergies.  History reviewed. No pertinent family history.  Social History Social History  Substance Use Topics  . Smoking status: Former Smoker    Quit date:  03/20/1964  . Smokeless tobacco: Never Used  . Alcohol use No    Review of Systems  Constitutional: No fever/chills Eyes: No visual changes. ENT: No sore throat. Cardiovascular: Denies chest pain. Respiratory: Denies shortness of breath. Gastrointestinal:No nausea, no vomiting.  No diarrhea.  No constipation. Genitourinary: Negative for dysuria. Musculoskeletal: as above Skin: Negative for rash. Neurological: Negative for headaches, focal weakness or  numbness.   ____________________________________________   PHYSICAL EXAM:  VITAL SIGNS: ED Triage Vitals [10/06/16 2231]  Enc Vitals Group     BP (!) 148/77     Pulse Rate 87     Resp 18     Temp 98.4 F (36.9 C)     Temp Source Oral     SpO2 96 %     Weight 190 lb (86.2 kg)     Height 5\' 11"  (1.803 m)     Head Circumference      Peak Flow      Pain Score 10     Pain Loc      Pain Edu?      Excl. in GC?     Constitutional: Alert and oriented. Well appearing and in no acute distress. Eyes: Conjunctivae are normal. PERRL. EOMI. Head: Atraumatic. Nose: No congestion/rhinnorhea. Mouth/Throat: Mucous membranes are moist.   Neck: No stridor.   Cardiovascular: Normal rate, regular rhythm. Grossly normal heart sounds.  Respiratory: Normal respiratory effort.  No retractions. Lungs CTAB. Gastrointestinal: Soft and nontender. No distention.  No CVA tenderness. Musculoskeletal: No lower extremity ttp.  Mild edema to the right lower extremity which the patient says is ongoing and chronic.  No joint effusions. Neurologic:  Normal speech and language. No gross focal neurologic deficits are appreciated. No gait instability. Skin:  Skin is warm, dry and intact. No rash noted. Psychiatric: Mood and affect are normal. Speech and behavior are normal.  ____________________________________________   LABS (all labs ordered are listed, but only abnormal results are displayed)  Labs Reviewed  URINALYSIS, COMPLETE (UACMP) WITH MICROSCOPIC - Abnormal; Notable for the following:       Result Value   Color, Urine YELLOW (*)    APPearance CLEAR (*)    Protein, ur 30 (*)    Squamous Epithelial / LPF 0-5 (*)    All other components within normal limits  CBC - Abnormal; Notable for the following:    RBC 3.88 (*)    Hemoglobin 11.6 (*)    HCT 33.7 (*)    All other components within normal limits  BASIC METABOLIC PANEL - Abnormal; Notable for the following:    Glucose, Bld 151 (*)    GFR  calc non Af Amer 59 (*)    All other components within normal limits   ____________________________________________  EKG   ____________________________________________  RADIOLOGY   ____________________________________________   PROCEDURES  Procedure(s) performed:   Procedures  Critical Care performed:   ____________________________________________   INITIAL IMPRESSION / ASSESSMENT AND PLAN / ED COURSE  Pertinent labs & imaging results that were available during my care of the patient were reviewed by me and considered in my medical decision making (see chart for details).  Discussed the imaging with Dr. Cherly Hensenhang of radiology who does not see any dissection or acute graphic finding in the vasculature.  Likely abdominal wall strain. Will prescribe Voltaren for home use. Explained the diagnosis as well as the plan to the patient as well as the family and they're understanding and willing to comply.  ____________________________________________   FINAL CLINICAL IMPRESSION(S) / ED DIAGNOSES  Abdominal wall strain.    NEW MEDICATIONS STARTED DURING THIS VISIT:  New Prescriptions   No medications on file     Note:  This document was prepared using Dragon voice recognition software and may include unintentional dictation errors.    Myrna Blazer, MD 10/07/16 [phone]

## 2016-10-12 ENCOUNTER — Other Ambulatory Visit: Payer: Self-pay | Admitting: Internal Medicine

## 2016-10-12 DIAGNOSIS — M545 Low back pain, unspecified: Secondary | ICD-10-CM

## 2016-10-20 ENCOUNTER — Ambulatory Visit
Admission: RE | Admit: 2016-10-20 | Discharge: 2016-10-20 | Disposition: A | Discharge status: Home / Self Care | Payer: Medicare Other | Source: Ambulatory Visit | Attending: Internal Medicine | Admitting: Internal Medicine

## 2016-10-20 DIAGNOSIS — M545 Low back pain, unspecified: Secondary | ICD-10-CM

## 2016-10-20 DIAGNOSIS — M5126 Other intervertebral disc displacement, lumbar region: Secondary | ICD-10-CM | POA: Insufficient documentation

## 2016-10-20 DIAGNOSIS — M4326 Fusion of spine, lumbar region: Secondary | ICD-10-CM | POA: Diagnosis not present

## 2016-10-20 MED ORDER — GADOBENATE DIMEGLUMINE 529 MG/ML IV SOLN
20.0000 mL | Freq: Once | INTRAVENOUS | Status: AC | PRN
  Administered 2016-10-20: 20 mL via INTRAVENOUS

## 2017-10-15 ENCOUNTER — Other Ambulatory Visit: Payer: Self-pay | Admitting: Internal Medicine

## 2017-10-15 DIAGNOSIS — R2681 Unsteadiness on feet: Secondary | ICD-10-CM

## 2017-10-15 DIAGNOSIS — R2689 Other abnormalities of gait and mobility: Secondary | ICD-10-CM

## 2017-10-15 DIAGNOSIS — I10 Essential (primary) hypertension: Secondary | ICD-10-CM

## 2017-10-25 ENCOUNTER — Ambulatory Visit
Admission: RE | Admit: 2017-10-25 | Discharge: 2017-10-25 | Disposition: A | Discharge status: Home / Self Care | Payer: Medicare Other | Source: Ambulatory Visit | Attending: Internal Medicine | Admitting: Internal Medicine

## 2017-10-25 DIAGNOSIS — R9089 Other abnormal findings on diagnostic imaging of central nervous system: Secondary | ICD-10-CM | POA: Insufficient documentation

## 2017-10-25 DIAGNOSIS — G319 Degenerative disease of nervous system, unspecified: Secondary | ICD-10-CM | POA: Insufficient documentation

## 2017-10-25 DIAGNOSIS — I10 Essential (primary) hypertension: Secondary | ICD-10-CM | POA: Diagnosis not present

## 2017-10-25 DIAGNOSIS — R2681 Unsteadiness on feet: Secondary | ICD-10-CM | POA: Diagnosis present

## 2017-10-25 DIAGNOSIS — R2689 Other abnormalities of gait and mobility: Secondary | ICD-10-CM | POA: Diagnosis not present

## 2017-12-06 ENCOUNTER — Other Ambulatory Visit: Payer: Self-pay

## 2017-12-06 ENCOUNTER — Emergency Department
Admission: EM | Admit: 2017-12-06 | Discharge: 2017-12-06 | Disposition: A | Discharge status: Home / Self Care | Payer: Medicare Other | Attending: Emergency Medicine | Admitting: Emergency Medicine

## 2017-12-06 DIAGNOSIS — E119 Type 2 diabetes mellitus without complications: Secondary | ICD-10-CM | POA: Insufficient documentation

## 2017-12-06 DIAGNOSIS — Z7984 Long term (current) use of oral hypoglycemic drugs: Secondary | ICD-10-CM | POA: Diagnosis not present

## 2017-12-06 DIAGNOSIS — I10 Essential (primary) hypertension: Secondary | ICD-10-CM | POA: Diagnosis present

## 2017-12-06 DIAGNOSIS — Z87891 Personal history of nicotine dependence: Secondary | ICD-10-CM | POA: Diagnosis not present

## 2017-12-06 DIAGNOSIS — Z79899 Other long term (current) drug therapy: Secondary | ICD-10-CM | POA: Diagnosis not present

## 2017-12-06 NOTE — ED Triage Notes (Signed)
Pt arrives to ED via POV with c/o hypertension. Pt reports checking his BP regularly and compliance with prescribed medications; states it was 145/75 at 8am today, and 187/97 PTA. Pt denies any c/o headache, no blurry vision or ringing in the ears. Pt denies any c/o SHOB or CP.

## 2017-12-06 NOTE — Discharge Instructions (Addendum)
Please seek medical attention for any high fevers, chest pain, shortness of breath, change in behavior, persistent vomiting, bloody stool or any other new or concerning symptoms.  

## 2017-12-06 NOTE — ED Provider Notes (Signed)
[facility] Emergency Department Provider Note  ____________________________________________   I have reviewed the triage vital signs and the nursing notes.   HISTORY  Chief Complaint Hypertension   History limited by: Not Limited   HPI Luke Owen is a 77 y.o. male who presents to the emergency department today because of concern for elevated blood pressure. Patient takes his blood pressure daily. This morning he noticed it was elevated. When he checked it again this afternoon it remained elevated. The patient denies any symptoms. He denies any headache, chest pain, shortness of breath. Denies any recent change in medication.   Per medical record review patient has a history of hypertension.  Past Medical History:  Diagnosis Date  . Arthritis   . Diabetes mellitus without complication (HCC)   . Dysphagia   . GERD (gastroesophageal reflux disease)   . Hyperlipidemia   . Hypertension   . Shoulder joint pain     There are no active problems to display for this patient.   Past Surgical History:  Procedure Laterality Date  . BACK SURGERY     2012  . COLONOSCOPY WITH PROPOFOL N/A 03/05/2015   Procedure: COLONOSCOPY WITH PROPOFOL;  Surgeon: Elnita MaxwellMatthew Gordon Rein, MD;  [facility];  Service: Endoscopy;  Laterality: N/A;  . ESOPHAGOGASTRODUODENOSCOPY (EGD) WITH PROPOFOL N/A 03/05/2015   Procedure: ESOPHAGOGASTRODUODENOSCOPY (EGD) WITH PROPOFOL;  Surgeon: Elnita MaxwellMatthew Gordon Rein, MD;  [facility];  Service: Endoscopy;  Laterality: N/A;    Prior to Admission medications   Medication Sig Start Date End Date Taking? Authorizing Provider  Albuterol Sulfate (PROAIR RESPICLICK) 108 (90 BASE) MCG/ACT AEPB Inhale 2 puffs into the lungs 4 (four) times daily as needed.    [provider]  aspirin EC 81 MG tablet Take 325 mg by mouth daily.    [provider]  atorvastatin (LIPITOR) 40 MG tablet Take 40 mg by mouth daily.     [provider]  clobetasol (TEMOVATE) 0.05 % external solution Apply 1 application topically 2 (two) times daily as needed.    [provider]  Diclofenac Sodium 3 % GEL Place 1 application onto the skin every 12 (twelve) hours as needed. 10/07/16   Schaevitz, Myra Rudeavid Matthew, MD  ferrous sulfate 325 (65 FE) MG tablet Take 325 mg by mouth daily with breakfast.    [provider]  glipiZIDE (GLUCOTROL XL) 10 MG 24 hr tablet Take 10 mg by mouth daily with breakfast.    [provider]  glipiZIDE (GLUCOTROL) 5 MG tablet Take 5 mg by mouth at bedtime.    [provider]  hydrochlorothiazide (HYDRODIURIL) 25 MG tablet Take 25 mg by mouth daily as needed.    [provider]  losartan-hydrochlorothiazide (HYZAAR) 100-25 MG tablet Take 1 tablet by mouth daily.    [provider]  metFORMIN (GLUCOPHAGE) 1000 MG tablet Take 1,000 mg by mouth 2 (two) times daily with a meal.    [provider]  mometasone (ELOCON) 0.1 % cream Apply 1 application topically daily.    [provider]  Multiple Vitamin (MULTIVITAMIN) tablet Take 1 tablet by mouth daily.    [provider]    Allergies Morphine and related  No family history on file.  Social History Social History   Tobacco Use  . Smoking status: Former Smoker    Last attempt to quit: 03/20/1964    Years since quitting: 53.7  . Smokeless tobacco: Never Used  Substance Use Topics  . Alcohol use: No  .  Drug use: No    Review of Systems Constitutional: No fever/chills Eyes: No visual changes. ENT: No sore throat. Cardiovascular: Denies chest pain. Respiratory: Denies shortness of breath. Gastrointestinal: No abdominal pain.  No nausea, no vomiting.  No diarrhea.   Genitourinary: Negative for dysuria. Musculoskeletal: Negative for back pain. Skin: Negative for rash. Neurological: Negative for headaches, focal weakness or  numbness.  ____________________________________________   PHYSICAL EXAM:  VITAL SIGNS: ED Triage Vitals  Enc Vitals Group     BP 12/06/17 1906 (!) 161/76     Pulse Rate 12/06/17 1906 78     Resp 12/06/17 1906 18     Temp 12/06/17 1906 98 F (36.7 C)     Temp Source 12/06/17 1906 Oral     SpO2 12/06/17 1906 100 %     Weight 12/06/17 1907 193 lb (87.5 kg)     Height 12/06/17 1907 5\' 11"  (1.803 m)     Head Circumference --      Peak Flow --      Pain Score 12/06/17 1907 0    Constitutional: Alert and oriented.  Eyes: Conjunctivae are normal.  ENT      Head: Normocephalic and atraumatic.      Nose: No congestion/rhinnorhea.      Mouth/Throat: Mucous membranes are moist.      Neck: No stridor. Hematological/Lymphatic/Immunilogical: No cervical lymphadenopathy. Cardiovascular: Normal rate, regular rhythm.  No murmurs, rubs, or gallops.  Respiratory: Normal respiratory effort without tachypnea nor retractions. Breath sounds are clear and equal bilaterally. No wheezes/rales/rhonchi. Gastrointestinal: Soft and non tender. No rebound. No guarding.  Genitourinary: Deferred Musculoskeletal: Normal range of motion in all extremities.  Neurologic:  Normal speech and language. No gross focal neurologic deficits are appreciated.  Skin:  Skin is warm, dry and intact. No rash noted. Psychiatric: Mood and affect are normal. Speech and behavior are normal. Patient exhibits appropriate insight and judgment.  ____________________________________________    LABS (pertinent positives/negatives)  None  ____________________________________________   EKG  None  ____________________________________________    RADIOLOGY  None  ____________________________________________   PROCEDURES  Procedures  ____________________________________________   INITIAL IMPRESSION / ASSESSMENT AND PLAN / ED COURSE  Pertinent labs & imaging results that were available during my care of the  patient were reviewed by me and considered in my medical decision making (see chart for details).   Patient presented to the emergency department for asymptomatic hypertension.  I had a discussion with the patient about hypertension and goals of care.  Will discharge to follow-up with primary care.   ____________________________________________   FINAL CLINICAL IMPRESSION(S) / ED DIAGNOSES  Final diagnoses:  Hypertension, unspecified type     Note: This dictation was prepared with Dragon dictation. Any transcriptional errors that result from this process are unintentional     Phineas Semen, MD 12/06/17 1945

## 2018-11-30 IMAGING — MR MR HEAD W/O CM
10 series · 48 of 48 positions shown · non-contrast
Comparison: None.

CLINICAL DATA: Unsteadiness on feet. Episode of loss of balance.
Symptoms have since resolved. Symptoms improved after changing
medication.

EXAM:
MRI HEAD WITHOUT CONTRAST
TECHNIQUE: Multiplanar, multiecho pulse sequences of the brain and surrounding
structures were obtained without intravenous contrast.

[Series 2: T1 · sagittal · 5.0mm · 0.45mm/px · 3 of 27 slices shown (1 of 2)]
[im 1/27]
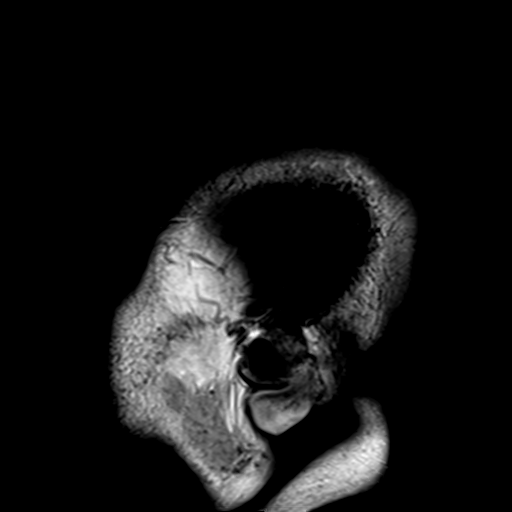
[im 14/27]
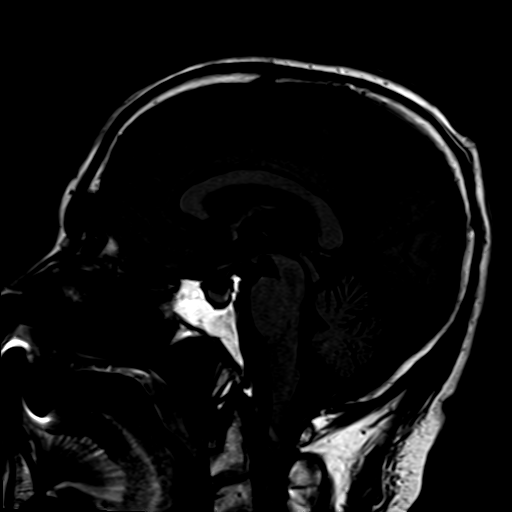
[im 27/27]
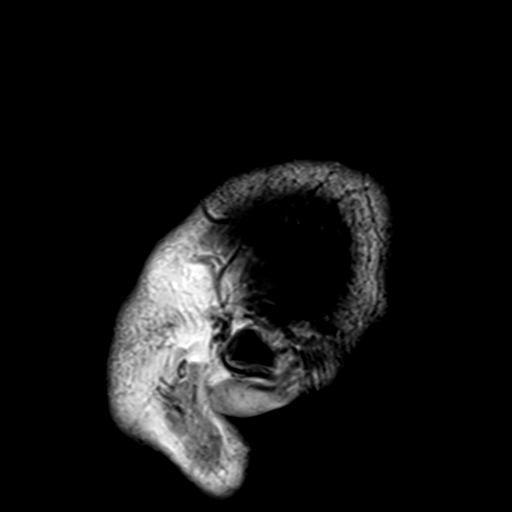

[Series 4: DWI · axial · 3.0mm · 1.80mm/px · z∈[-64,+93]mm · 5 of 50 slices shown (1 of 2)]
[im 1/50]
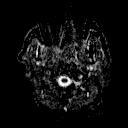
[im 13/50]
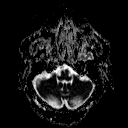
[im 25/50]
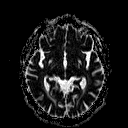
[im 37/50]
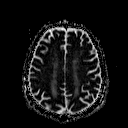
[im 50/50]
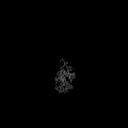

[Series 6: DWI · coronal · 3.0mm · 1.80mm/px · 4 of 45 slices shown (2 of 2)]
[im 1/45]
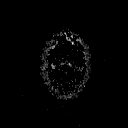
[im 15/45]
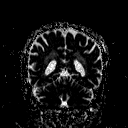
[im 30/45]
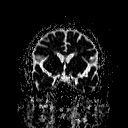
[im 45/45]
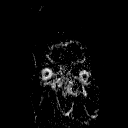

[Series 7: T2 · axial · 5.0mm · 0.60mm/px · z∈[-62,+90]mm · 2 of 25 slices shown (1 of 3)]
[im 1/25]
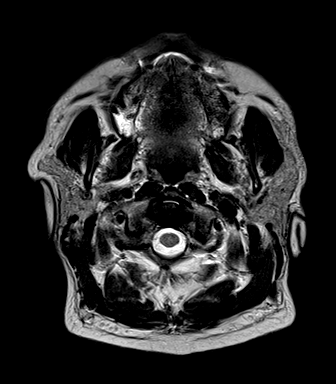
[im 25/25]
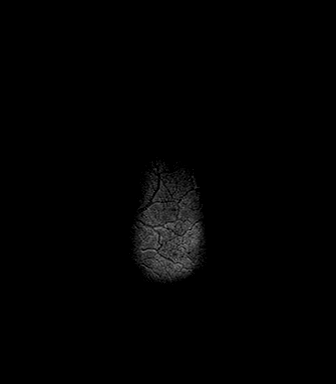

[Series 8: FLAIR · axial · 3.0mm · 0.45mm/px · z∈[-62,+90]mm · 5 of 53 slices shown]
[im 1/53]
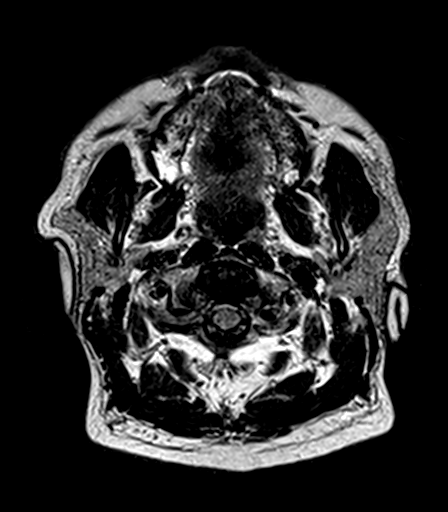
[im 14/53]
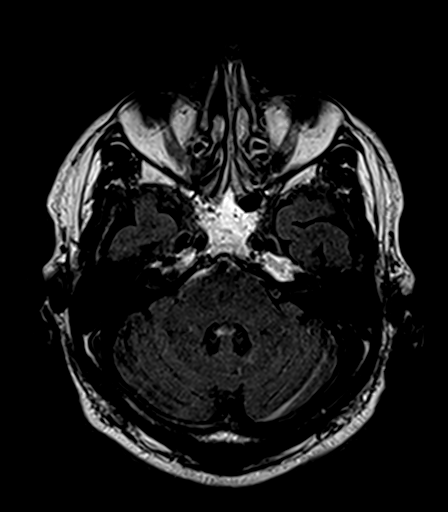
[im 27/53]
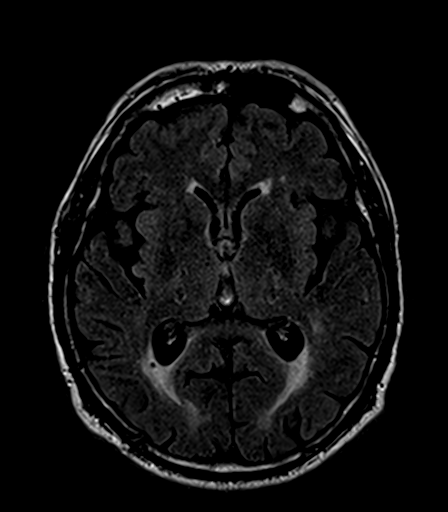
[im 40/53]
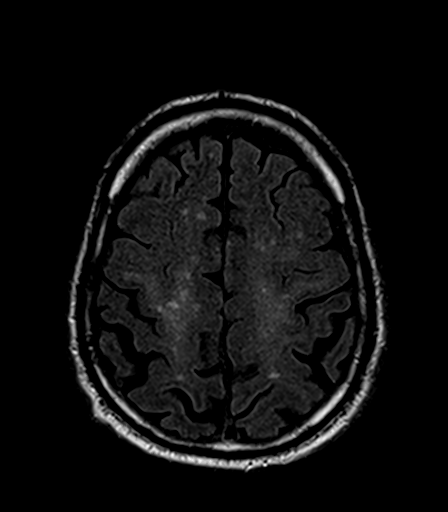
[im 53/53]
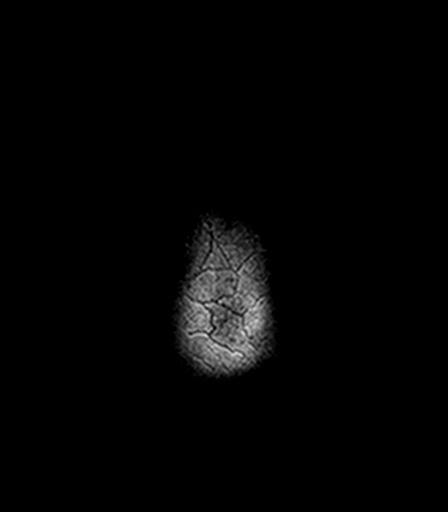

[Series 9: T2 · axial · 5.0mm · 0.45mm/px · z∈[-62,+90]mm · 2 of 25 slices shown (2 of 3)]
[im 1/25]
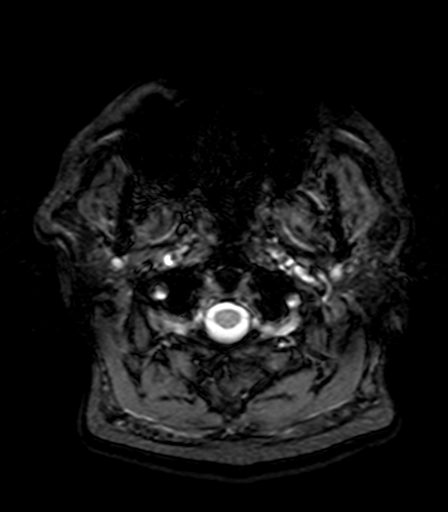
[im 25/25]
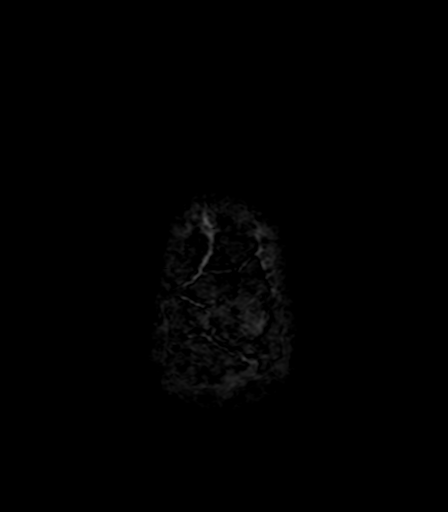

[Series 10: T1 · axial · 1.0mm · 1.00mm/px · z∈[-68,+102]mm · 16 of 176 slices shown (2 of 2)]
[im 1/176]
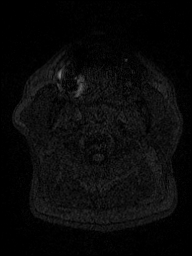
[im 12/176]
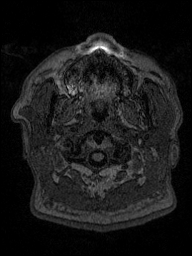
[im 24/176]
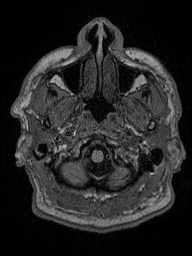
[im 36/176]
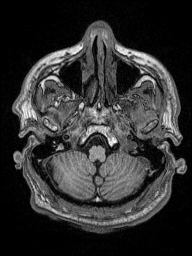
[im 47/176]
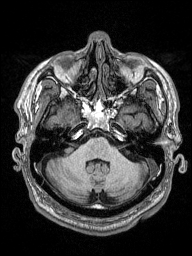
[im 59/176]
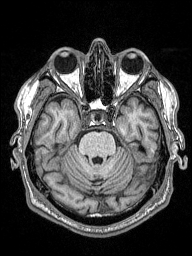
[im 71/176]
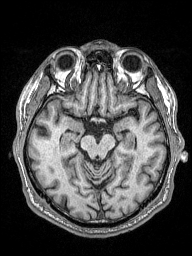
[im 82/176]
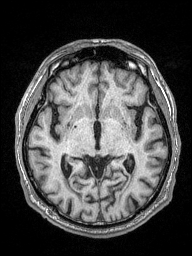
[im 94/176]
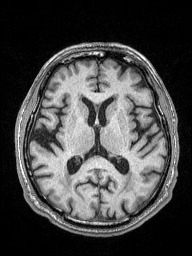
[im 106/176]
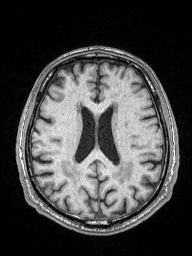
[im 117/176]
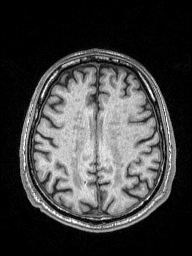
[im 129/176]
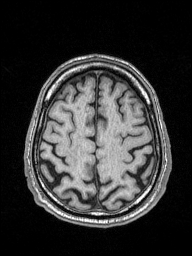
[im 141/176]
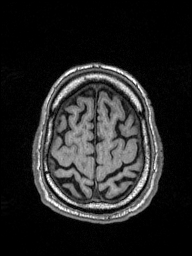
[im 152/176]
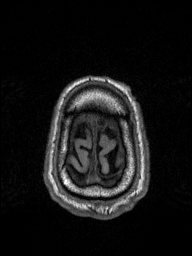
[im 164/176]
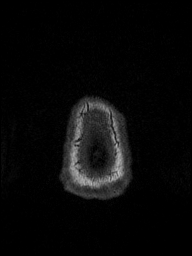
[im 176/176]
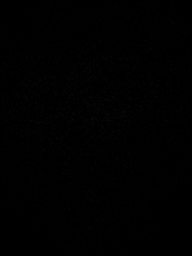

[Series 11: T2 · coronal · 5.0mm · 0.49mm/px · 2 of 27 slices shown (3 of 3)]
[im 1/27]
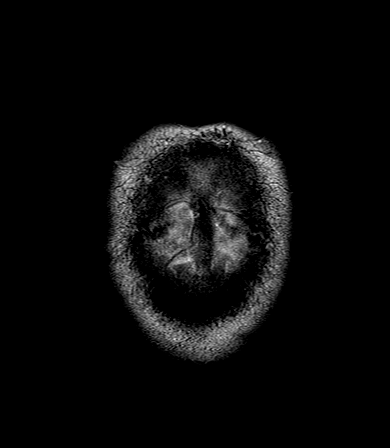
[im 27/27]
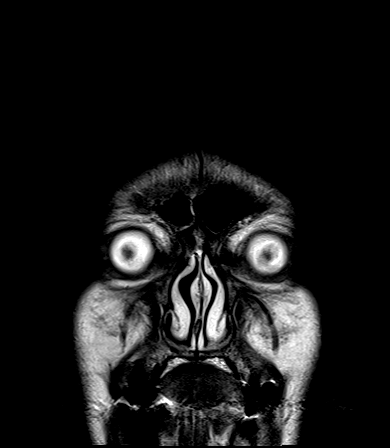

[Series 100: (id) · axial · 3.0mm · 1.80mm/px · z∈[-64,+93]mm · 5 of 55 slices shown]
[im 1/55]
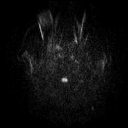
[im 14/55]
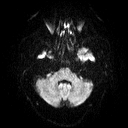
[im 28/55]
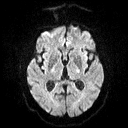
[im 41/55]
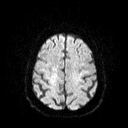
[im 55/55]
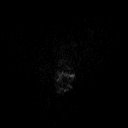

[Series 101: (id) cor · coronal · 3.0mm · 1.80mm/px · 4 of 45 slices shown]
[im 1/45]
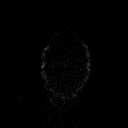
[im 15/45]
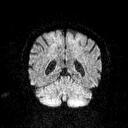
[im 30/45]
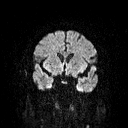
[im 45/45]
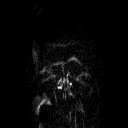

[48 of 48 positions shown; findings below may reference images not displayed]

FINDINGS: Brain: Mild atrophy is present. There is moderate confluent
periventricular and subcortical T2 hyperintensity bilaterally. No
acute hemorrhage or mass lesion is present. White matter changes
extend into the brainstem. No significant cortical infarcts are
present. The ventricles are of normal size. No significant
extra-axial fluid collection is present.

Vascular: Flow is present in the major intracranial arteries.

Skull and upper cervical spine: Skull base is within normal limits.
The craniocervical junction is normal. The upper cervical spine is
unremarkable.

Sinuses/Orbits: The paranasal sinuses and mastoid air cells are
clear. Globes and orbits are within normal limits.
IMPRESSION: 1. No acute or subacute infarct.
2. Periventricular and subcortical white matter changes are
moderately advanced for age. This likely reflects the sequela of
chronic microvascular ischemia.
3. Mild atrophy is within normal limits for age.

## 2019-01-28 DEATH — deceased

## 2020-10-27 DEATH — deceased
